# Patient Record
Sex: Male | Born: 1985 | Race: White | Hispanic: No | Marital: Single | Smoking: Heavy tobacco smoker
Health system: Southern US, Community
[De-identification: ages and names within clinical notes are randomized; demographics above are authoritative.]

## PROBLEM LIST (undated history)

## (undated) DIAGNOSIS — N200 Calculus of kidney: Secondary | ICD-10-CM

---

## 2007-11-24 ENCOUNTER — Emergency Department (HOSPITAL_COMMUNITY): Admission: EM | Admit: 2007-11-24 | Discharge: 2007-11-24 | Payer: Self-pay | Admitting: Emergency Medicine

## 2011-05-01 ENCOUNTER — Emergency Department (HOSPITAL_BASED_OUTPATIENT_CLINIC_OR_DEPARTMENT_OTHER)
Admission: EM | Admit: 2011-05-01 | Discharge: 2011-05-01 | Disposition: A | Discharge status: Home / Self Care | Payer: Self-pay | Attending: Emergency Medicine | Admitting: Emergency Medicine

## 2011-05-01 DIAGNOSIS — K089 Disorder of teeth and supporting structures, unspecified: Secondary | ICD-10-CM | POA: Insufficient documentation

## 2015-06-16 ENCOUNTER — Emergency Department (HOSPITAL_COMMUNITY)
Admission: EM | Admit: 2015-06-16 | Discharge: 2015-06-16 | Disposition: A | Discharge status: Home / Self Care | Payer: Self-pay | Attending: Emergency Medicine | Admitting: Emergency Medicine

## 2015-06-16 ENCOUNTER — Emergency Department (HOSPITAL_COMMUNITY): Payer: Self-pay

## 2015-06-16 ENCOUNTER — Encounter (HOSPITAL_COMMUNITY): Payer: Self-pay | Admitting: Emergency Medicine

## 2015-06-16 DIAGNOSIS — E860 Dehydration: Secondary | ICD-10-CM | POA: Insufficient documentation

## 2015-06-16 DIAGNOSIS — T679XXA Effect of heat and light, unspecified, initial encounter: Secondary | ICD-10-CM | POA: Insufficient documentation

## 2015-06-16 DIAGNOSIS — R55 Syncope and collapse: Secondary | ICD-10-CM | POA: Insufficient documentation

## 2015-06-16 DIAGNOSIS — Z87442 Personal history of urinary calculi: Secondary | ICD-10-CM | POA: Insufficient documentation

## 2015-06-16 DIAGNOSIS — R202 Paresthesia of skin: Secondary | ICD-10-CM | POA: Insufficient documentation

## 2015-06-16 DIAGNOSIS — Z720 Tobacco use: Secondary | ICD-10-CM | POA: Insufficient documentation

## 2015-06-16 DIAGNOSIS — Y998 Other external cause status: Secondary | ICD-10-CM | POA: Insufficient documentation

## 2015-06-16 DIAGNOSIS — Y9289 Other specified places as the place of occurrence of the external cause: Secondary | ICD-10-CM | POA: Insufficient documentation

## 2015-06-16 DIAGNOSIS — X30XXXA Exposure to excessive natural heat, initial encounter: Secondary | ICD-10-CM | POA: Insufficient documentation

## 2015-06-16 DIAGNOSIS — Y9301 Activity, walking, marching and hiking: Secondary | ICD-10-CM | POA: Insufficient documentation

## 2015-06-16 DIAGNOSIS — R0789 Other chest pain: Secondary | ICD-10-CM | POA: Insufficient documentation

## 2015-06-16 HISTORY — DX: Calculus of kidney: N20.0

## 2015-06-16 LAB — COMPREHENSIVE METABOLIC PANEL
ALBUMIN: 4 g/dL (ref 3.5–5.0)
ALT: 9 U/L — ABNORMAL LOW (ref 17–63)
AST: 21 U/L (ref 15–41)
Alkaline Phosphatase: 88 U/L (ref 38–126)
Anion gap: 10 (ref 5–15)
BUN: 12 mg/dL (ref 6–20)
CO2: 23 mmol/L (ref 22–32)
CREATININE: 0.74 mg/dL (ref 0.61–1.24)
Calcium: 9.2 mg/dL (ref 8.9–10.3)
Chloride: 108 mmol/L (ref 101–111)
Glucose, Bld: 91 mg/dL (ref 65–99)
POTASSIUM: 3.8 mmol/L (ref 3.5–5.1)
SODIUM: 141 mmol/L (ref 135–145)
Total Bilirubin: 0.8 mg/dL (ref 0.3–1.2)
Total Protein: 7.3 g/dL (ref 6.5–8.1)

## 2015-06-16 LAB — CBC WITH DIFFERENTIAL/PLATELET
BASOS ABS: 0 10*3/uL (ref 0.0–0.1)
BASOS PCT: 0 % (ref 0–1)
Eosinophils Absolute: 0.2 10*3/uL (ref 0.0–0.7)
Eosinophils Relative: 1 % (ref 0–5)
HEMATOCRIT: 41.9 % (ref 39.0–52.0)
HEMOGLOBIN: 14.5 g/dL (ref 13.0–17.0)
LYMPHS ABS: 4 10*3/uL (ref 0.7–4.0)
LYMPHS PCT: 31 % (ref 12–46)
MCH: 30.2 pg (ref 26.0–34.0)
MCHC: 34.6 g/dL (ref 30.0–36.0)
MCV: 87.3 fL (ref 78.0–100.0)
MONOS PCT: 6 % (ref 3–12)
Monocytes Absolute: 0.8 10*3/uL (ref 0.1–1.0)
NEUTROS ABS: 8 10*3/uL — AB (ref 1.7–7.7)
Neutrophils Relative %: 62 % (ref 43–77)
Platelets: 217 10*3/uL (ref 150–400)
RBC: 4.8 MIL/uL (ref 4.22–5.81)
RDW: 13.1 % (ref 11.5–15.5)
WBC: 13 10*3/uL — AB (ref 4.0–10.5)

## 2015-06-16 LAB — URINALYSIS, ROUTINE W REFLEX MICROSCOPIC
GLUCOSE, UA: NEGATIVE mg/dL
HGB URINE DIPSTICK: NEGATIVE
Ketones, ur: NEGATIVE mg/dL
Nitrite: NEGATIVE
PH: 5.5 (ref 5.0–8.0)
PROTEIN: NEGATIVE mg/dL
SPECIFIC GRAVITY, URINE: 1.033 — AB (ref 1.005–1.030)
UROBILINOGEN UA: 1 mg/dL (ref 0.0–1.0)

## 2015-06-16 LAB — URINE MICROSCOPIC-ADD ON

## 2015-06-16 LAB — I-STAT CG4 LACTIC ACID, ED: LACTIC ACID, VENOUS: 0.57 mmol/L (ref 0.5–2.0)

## 2015-06-16 LAB — CK: CK TOTAL: 236 U/L (ref 49–397)

## 2015-06-16 MED ORDER — ONDANSETRON HCL 4 MG/2ML IJ SOLN
4.0000 mg | Freq: Once | INTRAMUSCULAR | Status: AC
  Administered 2015-06-16: 4 mg via INTRAVENOUS
  Filled 2015-06-16: qty 2

## 2015-06-16 MED ORDER — IOHEXOL 300 MG/ML  SOLN
100.0000 mL | Freq: Once | INTRAMUSCULAR | Status: AC | PRN
  Administered 2015-06-16: 100 mL via INTRAVENOUS

## 2015-06-16 MED ORDER — IOHEXOL 300 MG/ML  SOLN
25.0000 mL | Freq: Once | INTRAMUSCULAR | Status: AC | PRN
  Administered 2015-06-16: 25 mL via ORAL

## 2015-06-16 MED ORDER — SODIUM CHLORIDE 0.9 % IV SOLN
1000.0000 mL | Freq: Once | INTRAVENOUS | Status: AC
  Administered 2015-06-16: 1000 mL via INTRAVENOUS

## 2015-06-16 MED ORDER — SODIUM CHLORIDE 0.9 % IV SOLN
1000.0000 mL | Freq: Once | INTRAVENOUS | Status: AC
Start: 2015-06-16 — End: 2015-06-16
  Administered 2015-06-16: 1000 mL via INTRAVENOUS

## 2015-06-16 MED ORDER — SODIUM CHLORIDE 0.9 % IV SOLN
1000.0000 mL | INTRAVENOUS | Status: DC
  Administered 2015-06-16: 1000 mL via INTRAVENOUS

## 2015-06-16 NOTE — ED Provider Notes (Signed)
CSN: [id number]     Arrival date & time 06/16/15  0207 History  This chart was scribed for Marisa Severin, MD by Doreatha Martin, ED Scribe. This patient was seen in room WA16/WA16 and the patient's care was started at 3:32 AM.     Chief Complaint  Patient presents with  . Heat Exposure  . Flank Pain   The history is provided by the patient. No language interpreter was used.    HPI Comments: Calvin Golden is a 29 y.o. male who presents to the Emergency Department complaining of moderate left sided sharp chest pain radiating to the flank onset 0130. Pt states associated episode of near-syncope, chest tightness and bilateral tingling of the feet and hands. He states he was walking from his house to his new job (an 8 hr walk) and had been walking for 7 hours when he experienced the pain. He states that he sat for 20 minutes with no relief of the pain. Pt reports that he had two 20oz bottles of water while walking. Pt states that pain is worsened with inspiration. Pt is a current smoker, 0.25 ppd. He denies falls and injuries, Hx of mono, current feeling symptoms similar to previous kidney stones. He also denies vomiting, sore throat.   Past Medical History  Diagnosis Date  . Kidney stones    History reviewed. No pertinent past surgical history. History reviewed. No pertinent family history. History  Substance Use Topics  . Smoking status: Heavy Tobacco Smoker -- 1.00 packs/day for 13 years    Types: Cigarettes  . Smokeless tobacco: Never Used  . Alcohol Use: Yes    Review of Systems  Respiratory: Positive for chest tightness.   Cardiovascular: Positive for chest pain.  Neurological:       Near-syncope, tingling of the hands and feet.  All other systems reviewed and are negative.  Allergies  Review of patient's allergies indicates no known allergies.  Home Medications   Prior to Admission medications   Not on File   BP 123/71 mmHg  Pulse 85  Temp(Src) 98.4 F (36.9 C) (Oral)  Resp 18   Ht 5\' 9"  (1.753 m)  Wt 200 lb (90.719 kg)  BMI 29.52 kg/m2  SpO2 99% Physical Exam  Constitutional: He is oriented to person, place, and time. He appears well-developed and well-nourished.  HENT:  Head: Normocephalic and atraumatic.  Nose: Nose normal.  Mouth/Throat: Oropharynx is clear and moist.  Eyes: Conjunctivae and EOM are normal. Pupils are equal, round, and reactive to light.  Neck: Normal range of motion. Neck supple. No JVD present. No tracheal deviation present. No thyromegaly present.  Cardiovascular: Normal rate, regular rhythm, normal heart sounds and intact distal pulses.  Exam reveals no gallop and no friction rub.   No murmur heard. Pulmonary/Chest: Effort normal and breath sounds normal. No stridor. No respiratory distress. He has no wheezes. He has no rales. He exhibits tenderness (tenderness with palpation over left chest wall).  Abdominal: Soft. Bowel sounds are normal. He exhibits no distension and no mass. There is tenderness (patient with tenderness to palpation of left upper quadrant). There is no rebound and no guarding.  Musculoskeletal: Normal range of motion. He exhibits no edema or tenderness.  Lymphadenopathy:    He has no cervical adenopathy.  Neurological: He is alert and oriented to person, place, and time. He displays normal reflexes. He exhibits normal muscle tone. Coordination normal.  Skin: Skin is warm and dry. No rash noted. No erythema. No pallor.  Psychiatric: He has a normal mood and affect. His behavior is normal. Judgment and thought content normal.  Nursing note and vitals reviewed.   ED Course  Procedures (including critical care time) DIAGNOSTIC STUDIES: Oxygen Saturation is 99% on RA, normal by my interpretation.    COORDINATION OF CARE: 3:38 AM Discussed treatment plan with pt at bedside and pt agreed to plan.   Labs Review Labs Reviewed  URINALYSIS, ROUTINE W REFLEX MICROSCOPIC (NOT AT [facility]) - Abnormal; Notable for the following:     Color, Urine AMBER (*)    APPearance CLOUDY (*)    Specific Gravity, Urine 1.033 (*)    Bilirubin Urine SMALL (*)    Leukocytes, UA SMALL (*)    All other components within normal limits  COMPREHENSIVE METABOLIC PANEL - Abnormal; Notable for the following:    ALT 9 (*)    All other components within normal limits  CBC WITH DIFFERENTIAL/PLATELET - Abnormal; Notable for the following:    WBC 13.0 (*)    Neutro Abs 8.0 (*)    All other components within normal limits  CK  URINE MICROSCOPIC-ADD ON  I-STAT CG4 LACTIC ACID, ED    Imaging Review Dg Chest 2 View  06/16/2015   CLINICAL DATA:  Initial evaluation for acute chest pain, shortness of breath.  EXAM: CHEST  2 VIEW  COMPARISON:  Prior study from 01/12/2015  FINDINGS: The cardiac and mediastinal silhouettes are stable in size and contour, and remain within normal limits.  The lungs are normally inflated. Mild elevation left hemidiaphragm is stable. No airspace consolidation, pleural effusion, or pulmonary edema is identified. There is no pneumothorax.  No acute osseous abnormality identified.  Scoliosis stable.  IMPRESSION: No active cardiopulmonary disease.   Electronically Signed   By: Rise Mu M.D.   On: 06/16/2015 04:20   Ct Abdomen Pelvis W Contrast  06/16/2015   CLINICAL DATA:  Left upper quadrant pain radiating to the left flank with splinting. Near syncope during 6 hour walk.  EXAM: CT ABDOMEN AND PELVIS WITH CONTRAST  TECHNIQUE: Multidetector CT imaging of the abdomen and pelvis was performed using the standard protocol following bolus administration of intravenous contrast.  CONTRAST:  OMNIPAQUE IOHEXOL 300 MG/ML  SOLN  COMPARISON:  None.  FINDINGS: Lower chest:  The included lung bases are clear.  Hepatobiliary: No focal hepatic lesion. The gallbladder is decompressed. No biliary dilatation.  Pancreas: Normal.  Spleen: Mild splenomegaly, spleen measures 14.8 cm in cranial caudal dimension.  Adrenals/Urinary  Tract: Symmetric renal enhancement. No hydronephrosis. No large renal stones. The adrenal glands are normal.  Stomach/Bowel: Stomach is physiologically distended. There are no dilated or thickened bowel loops. The appendix is normal. Equivocal mild diffuse colonic wall thickening in the ascending through the descending colon versus nondistention.  Vascular/Lymphatic: No retroperitoneal adenopathy. Abdominal aorta is normal in caliber.  Reproductive: Normal.  Bladder: Physiologically distended.  Other: Tiny fat containing umbilical hernia.  Musculoskeletal: There are no acute or suspicious osseous abnormalities.  IMPRESSION: 1. Equivocal mild colonic wall thickening versus nondistention. 2. Mild splenomegaly.   Electronically Signed   By: Rubye Oaks M.D.   On: 06/16/2015 07:02     EKG Interpretation None      MDM   Final diagnoses:  Dehydration  Heat effect, initial encounter    I personally performed the services described in this documentation, which was scribed in my presence. The recorded information has been reviewed and is accurate.  29 year old male with possible heat exhaustion,  reports walking 6 hours and then developing left chest and upper abdomen pain, near syncope.  Patient is not hyperthermic or tachycardic upon arrival.    Patient is tender to palpation left upper quadrant.  Plan for labs, urinalysis, IV fluids  Pt feeling better.  CT scan with mild splenomegaly.  Pt to increase fluids, return precautions given.  Marisa Severin, MD 06/16/15 [phone]

## 2015-06-16 NOTE — Discharge Instructions (Signed)
Heat-Related Illness °Heat-related illnesses occur when the body is unable to properly cool itself. The body normally cools itself by sweating. However, under some conditions sweating is not enough. In these cases, a person's body temperature rises rapidly. Very high body temperatures may damage the brain or other vital organs. Some examples of heat-related illnesses include: °· Heat stroke. This occurs when the body is unable to regulate its temperature. The body's temperature rises rapidly, the sweating mechanism fails, and the body is unable to cool down. Body temperature may rise to 106° F (41° C) or higher within 10 to 15 minutes. Heat stroke can cause death or permanent disability if emergency treatment is not provided. °· Heat exhaustion. This is a milder form of heat-related illness that can develop after several days of exposure to high temperatures and not enough fluids. It is the body's response to an excessive loss of the water and salt contained in sweat. °· Heat cramps. These usually affect people who sweat a lot during heavy activity. This sweating drains the body's salt and moisture. The low salt level in the muscles causes painful cramps. Heat cramps may also be a symptom of heat exhaustion. Heat cramps usually occur in the abdomen, arms, or legs. Get medical attention for cramps if you have heart problems or are on a low-sodium diet. °Those that are at greatest risk for heat-related illnesses include:  °· The elderly. °· Infant and the very young. °· People with mental illness and chronic diseases. °· People who are overweight (obese). °· Young and healthy people can even succumb to heat if they participate in strenuous physical activities during hot weather. °CAUSES  °Several factors affect the body's ability to cool itself during extremely hot weather. When the humidity is high, sweat will not evaporate as quickly. This prevents the body from releasing heat quickly. Other factors that can affect  the body's ability to cool down include:  °· Age. °· Obesity. °· Fever. °· Dehydration. °· Heart disease. °· Mental illness. °· Poor circulation. °· Sunburn. °· Prescription drug use. °· Alcohol use. °SYMPTOMS  °Heat stroke: Warning signs of heat stroke vary, but may include: °· An extremely high body temperature (above 103°F orally). °· A fast, strong pulse. °· Dizziness. °· Confusion. °· Red, hot, and dry skin. °· No sweating. °· Throbbing headache. °· Feeling sick to your stomach (nauseous). °· Unconsciousness. °Heat exhaustion: Warning signs of heat exhaustion include: °· Heavy sweating. °· Tiredness. °· Headache. °· Paleness. °· Weakness. °· Feeling sick to your stomach (nauseous) or vomiting. °· Muscle cramps. °Heat cramps °· Muscle pains or spasms. °TREATMENT  °Heat stroke °· Get into a cool environment. An indoor place that is air-conditioned may be best. °· Take a cool shower or bath. Have someone around to make sure you are okay. °· Take your temperature. Make sure it is going down. °Heat exhaustion °· Drink plenty of fluids. Do not drink liquids that contain caffeine, alcohol, or large amounts of sugar. These cause you to lose more body fluid. Also, avoid very cold drinks. They can cause stomach cramps. °· Get into a cool environment. An indoor place that is air-conditioned may be best. °· Take a cool shower or bath. Have someone around to make sure you are okay. °· Put on lightweight clothing. °Heat cramps °· Stop whatever activity you were doing. Do not attempt to do that activity for at least 3 hours after the cramps have gone away. °· Get into a cool environment. An indoor   place that is air-conditioned may be best. °HOME CARE INSTRUCTIONS  °To protect your health when temperatures are extremely high, follow these tips: °· During heavy exercise in a hot environment, drink two to four glasses (16-32 ounces) of cool fluids each hour. Do not wait until you are thirsty to drink. Warning: If your  caregiver limits the amount of fluid you drink or has you on water pills, ask how much you should drink while the weather is hot. °· Do not drink liquids that contain caffeine, alcohol, or large amounts of sugar. These cause you to lose more body fluid. °· Avoid very cold drinks. They can cause stomach cramps. °· Wear appropriate clothing. Choose lightweight, light-colored, loose-fitting clothing. °· If you must be outdoors, try to limit your outdoor activity to morning and evening hours. Try to rest often in shady areas. °· If you are not used to working or exercising in a hot environment, start slowly and pick up the pace gradually. °· Stay cool in an air-conditioned place if possible. If your home does not have air conditioning, go to the shopping mall or public library. °· Taking a cool shower or bath may help you cool off. °SEEK MEDICAL CARE IF:  °· You see any of the symptoms listed above. You may be dealing with a life-threatening emergency. °· Symptoms worsen or last longer than 1 hour. °· Heat cramps do not get better in 1 hour. °MAKE SURE YOU:  °· Understand these instructions. °· Will watch your condition. °· Will get help right away if you are not doing well or get worse. °Document Released: 08/15/2008 Document Revised: 01/29/2012 Document Reviewed: 08/15/2008 °[employer]® Patient Information ©2015 [employer]. This information is not intended to replace advice given to you by your health care provider. Make sure you discuss any questions you have with your health care provider. ° °Dehydration, Adult °Dehydration is when you lose more fluids from the body than you take in. Vital organs like the kidneys, brain, and heart cannot function without a proper amount of fluids and salt. Any loss of fluids from the body can cause dehydration.  °CAUSES  °· Vomiting. °· Diarrhea. °· Excessive sweating. °· Excessive urine output. °· Fever. °SYMPTOMS  °Mild dehydration °· Thirst. °· Dry lips. °· Slightly dry  mouth. °Moderate dehydration °· Very dry mouth. °· Sunken eyes. °· Skin does not bounce back quickly when lightly pinched and released. °· Dark urine and decreased urine production. °· Decreased tear production. °· Headache. °Severe dehydration °· Very dry mouth. °· Extreme thirst. °· Rapid, weak pulse (more than 100 beats per minute at rest). °· Cold hands and feet. °· Not able to sweat in spite of heat and temperature. °· Rapid breathing. °· Blue lips. °· Confusion and lethargy. °· Difficulty being awakened. °· Minimal urine production. °· No tears. °DIAGNOSIS  °Your caregiver will diagnose dehydration based on your symptoms and your exam. Blood and urine tests will help confirm the diagnosis. The diagnostic evaluation should also identify the cause of dehydration. °TREATMENT  °Treatment of mild or moderate dehydration can often be done at home by increasing the amount of fluids that you drink. It is best to drink small amounts of fluid more often. Drinking too much at one time can make vomiting worse. Refer to the home care instructions below. °Severe dehydration needs to be treated at the hospital where you will probably be given intravenous (IV) fluids that contain water and electrolytes. °HOME CARE INSTRUCTIONS  °· Ask your caregiver   about specific rehydration instructions.  Drink enough fluids to keep your urine clear or pale yellow.  Drink small amounts frequently if you have nausea and vomiting.  Eat as you normally do.  Avoid:  Foods or drinks high in sugar.  Carbonated drinks.  Juice.  Extremely hot or cold fluids.  Drinks with caffeine.  Fatty, greasy foods.  Alcohol.  Tobacco.  Overeating.  Gelatin desserts.  Wash your hands well to avoid spreading bacteria and viruses.  Only take over-the-counter or prescription medicines for pain, discomfort, or fever as directed by your caregiver.  Ask your caregiver if you should continue all prescribed and over-the-counter medicines.  Keep all  follow-up appointments with your caregiver. SEEK MEDICAL CARE IF:  You have abdominal pain and it increases or stays in one area (localizes).  You have a rash, stiff neck, or severe headache.  You are irritable, sleepy, or difficult to awaken.  You are weak, dizzy, or extremely thirsty. SEEK IMMEDIATE MEDICAL CARE IF:   You are unable to keep fluids down or you get worse despite treatment.  You have frequent episodes of vomiting or diarrhea.  You have blood or green matter (bile) in your vomit.  You have blood in your stool or your stool looks black and tarry.  You have not urinated in 6 to 8 hours, or you have only urinated a small amount of very dark urine.  You have a fever.  You faint. MAKE SURE YOU:   Understand these instructions.  Will watch your condition.  Will get help right away if you are not doing well or get worse. Document Released: 11/06/2005 Document Revised: 01/29/2012 Document Reviewed: 06/26/2011 [facility] Patient Information 2015 [place], [place]. This information is not intended to replace advice given to you by your health care provider. Make sure you discuss any questions you have with your health care provider.  Rehydration, Adult Rehydration is the replacement of body fluids lost during dehydration. Dehydration is an extreme loss of body fluids to the point of body function impairment. There are many ways extreme fluid loss can occur, including vomiting, diarrhea, or excess sweating. Recovering from dehydration requires replacing lost fluids, continuing to eat to maintain strength, and avoiding foods and beverages that may contribute to further fluid loss or may increase nausea. HOW TO REHYDRATE In most cases, rehydration involves the replacement of not only fluids but also carbohydrates and basic body salts. Rehydration with an oral rehydration solution is one way to replace essential nutrients lost through dehydration. An oral rehydration solution can  be purchased at pharmacies, retail stores, and online. Premixed packets of powder that you combine with water to make a solution are also sold. You can prepare an oral rehydration solution at home by mixing the following ingredients together:    - tsp table salt.   tsp baking soda.   tsp salt substitute containing potassium chloride.  1 tablespoons sugar.  1 L (34 oz) of water. Be sure to use exact measurements. Including too much sugar can make diarrhea worse. Drink -1 cup (120-240 mL) of oral rehydration solution each time you have diarrhea or vomit. If drinking this amount makes your vomiting worse, try drinking smaller amounts more often. For example, drink 1-3 tsp every 5-10 minutes.  A general rule for staying hydrated is to drink 1-2 L of fluid per day. Talk to your caregiver about the specific amount you should be drinking each day. Drink enough fluids to keep your urine clear or pale yellow. EATING  WHEN DEHYDRATED Even if you have had severe sweating or you are having diarrhea, do not stop eating. Many healthy items in a normal diet are okay to continue eating while recovering from dehydration. The following tips can help you to lessen nausea when you eat:  Ask someone else to prepare your food. Cooking smells may worsen nausea.  Eat in a well-ventilated room away from cooking smells.  Sit up when you eat. Avoid lying down until 1-2 hours after eating.  Eat small amounts when you eat.  Eat foods that are easy to digest. These include soft, well-cooked, or mashed foods. FOODS AND BEVERAGES TO AVOID Avoid eating or drinking the following foods and beverages that may increase nausea or further loss of fluid:   Fruit juices with a high sugar content, such as concentrated juices.  Alcohol.  Beverages containing caffeine.  Carbonated drinks. They may cause a lot of gas.  Foods that may cause a lot of gas, such as cabbage, broccoli, and beans.  Fatty, greasy, and fried  foods.  Spicy, very salty, and very sweet foods or drinks.  Foods or drinks that are very hot or very cold. Consume food or drinks at or near room temperature.  Foods that need a lot of chewing, such as raw vegetables.  Foods that are sticky or hard to swallow, such as peanut butter. Document Released: 01/29/2012 Document Revised: 07/31/2012 Document Reviewed: 01/29/2012 [facility] Patient Information 2015 [place], [place]. This information is not intended to replace advice given to you by your health care provider. Make sure you discuss any questions you have with your health care provider.

## 2015-06-16 NOTE — ED Notes (Signed)
Bed: OZ30 Expected date: 06/16/15 Expected time: 1:43 AM Means of arrival: Ambulance Comments: 29 yo M  Side pain, dizziness

## 2015-06-16 NOTE — ED Notes (Signed)
Patient has been walking every since 7pm due to car not working and he needed to get to work by 6 am. Patient complaining of pain in the left flank. Patient also complaining tingling in fingers and legs after walking a while and stopping.

## 2016-01-14 IMAGING — CT CT ABD-PELV W/ CM
2 of 4 series · 16 of 46 positions shown, 18 images · IV contrast (omnipaque)
Comparison: None.

CLINICAL DATA: Left upper quadrant pain radiating to the left flank
with splinting. Near syncope during 6 hour walk.

EXAM:
CT ABDOMEN AND PELVIS WITH CONTRAST
TECHNIQUE: Multidetector CT imaging of the abdomen and pelvis was performed
using the standard protocol following bolus administration of
intravenous contrast.
CONTRAST:  100mL OMNIPAQUE IOHEXOL 300 MG/ML  SOLN

[Series 2: abd/pel with · axial · 0.75mm/px · z∈[+1071,+1506]mm · 13 of 95 slices shown, 15 images]
[im 4/95  soft-tissue]
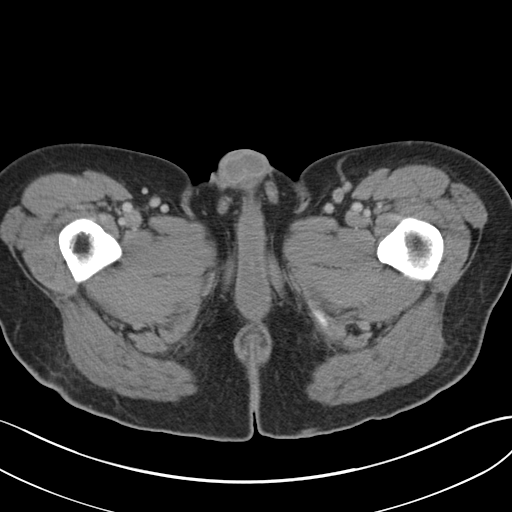
[im 4/95  bone]
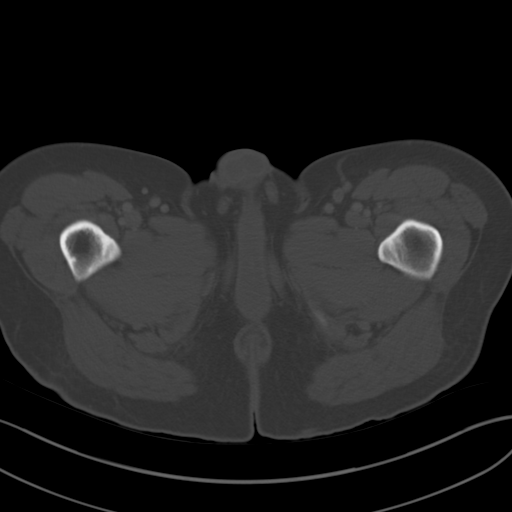
[im 11/95  soft-tissue]
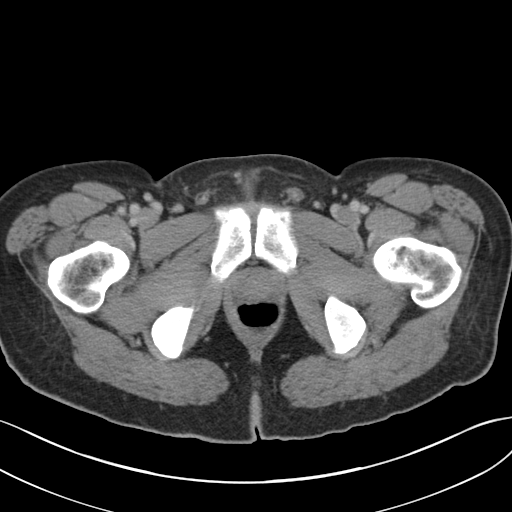
[im 19/95  soft-tissue]
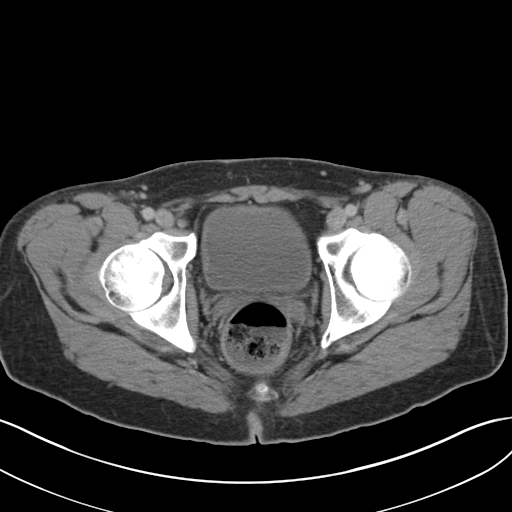
[im 26/95  soft-tissue]
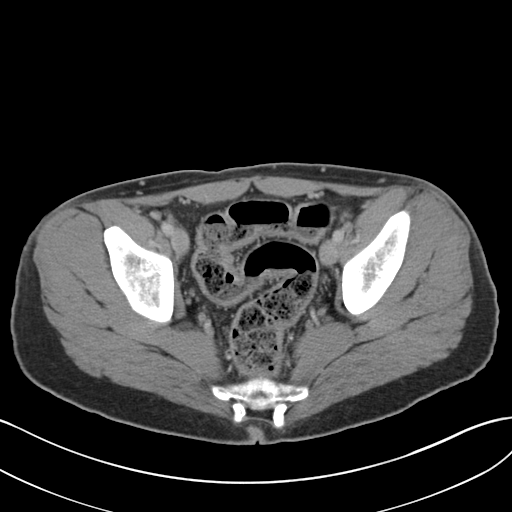
[im 33/95  soft-tissue]
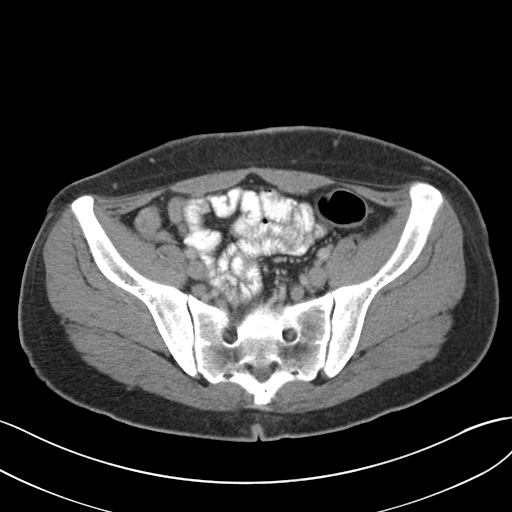
[im 40/95  soft-tissue]
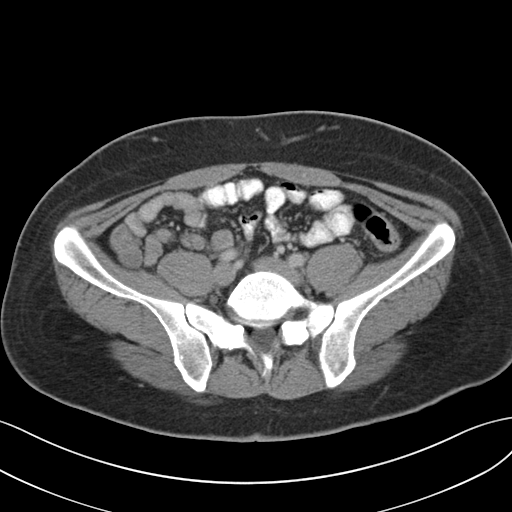
[im 48/95  soft-tissue]
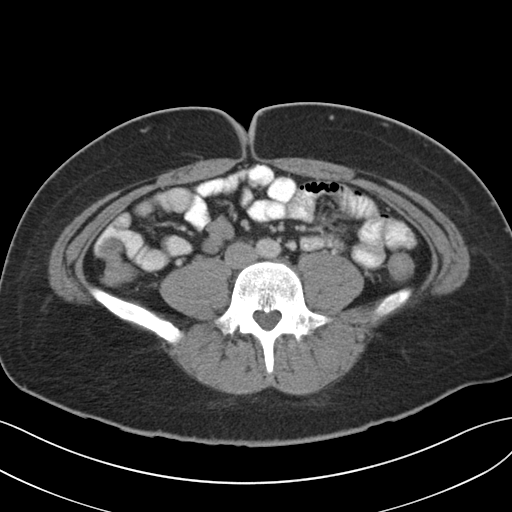
[im 55/95  soft-tissue]
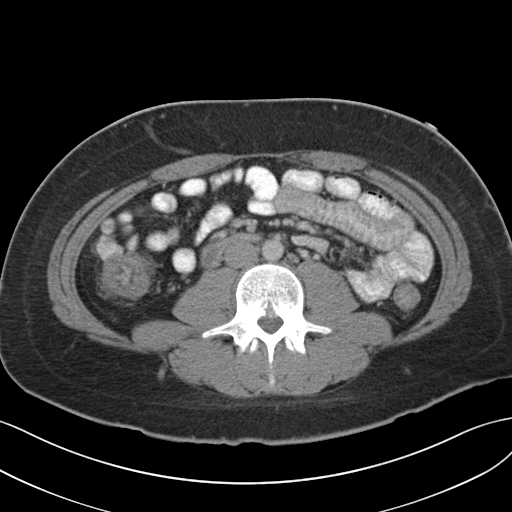
[im 62/95  soft-tissue]
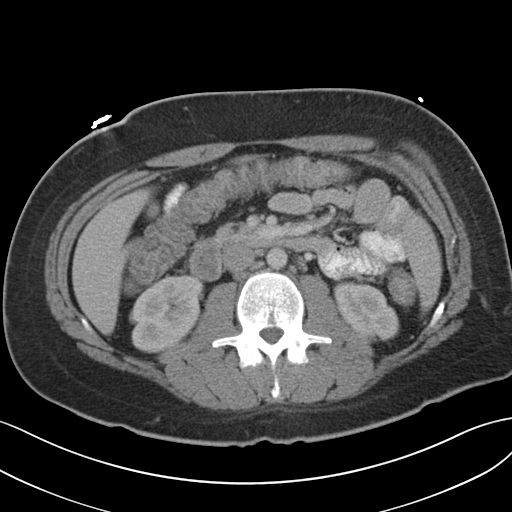
[im 62/95  bone]
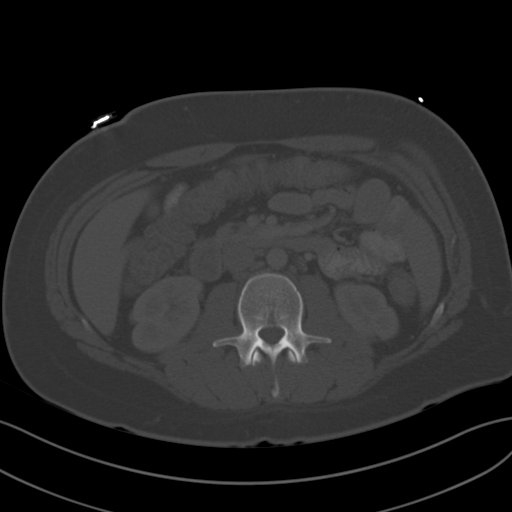
[im 69/95  soft-tissue]
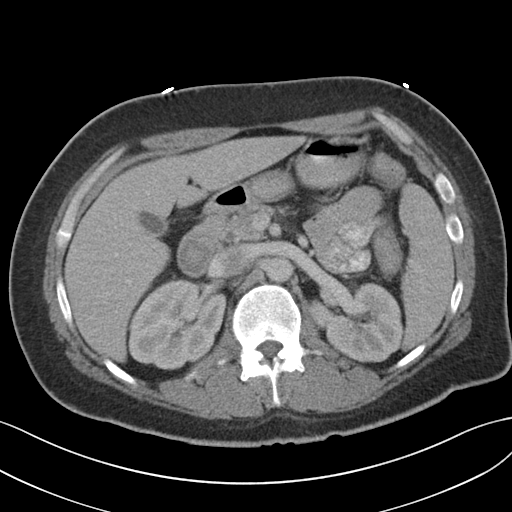
[im 76/95  soft-tissue]
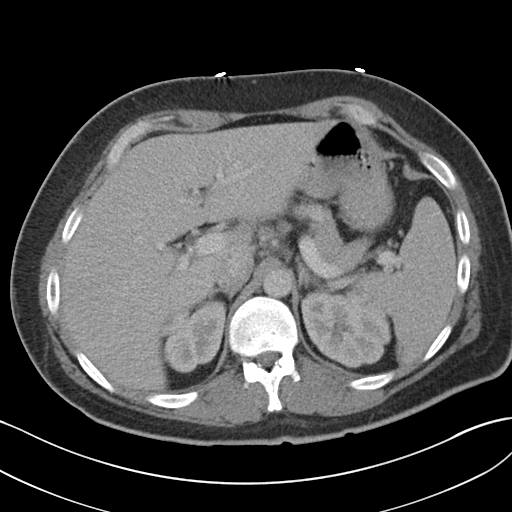
[im 84/95  soft-tissue]
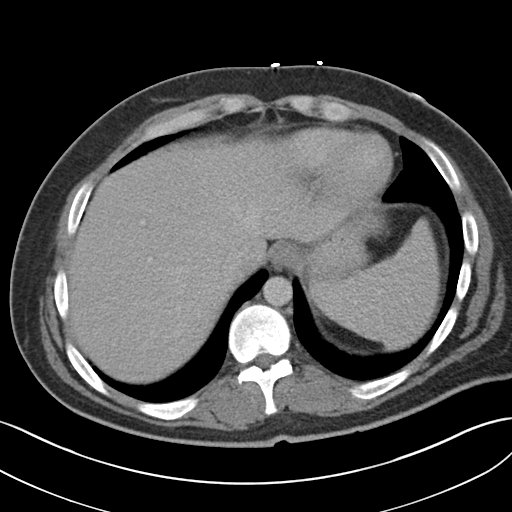
[im 91/95  soft-tissue]
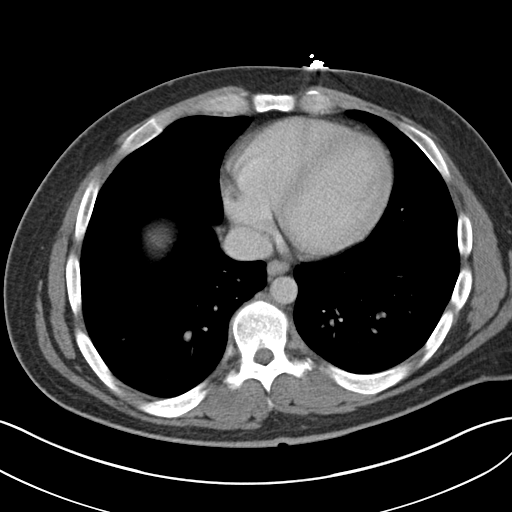

[Series 4: coronal a/|p · coronal · 0.72mm/px · 3 of 87 slices shown]
[im 29/87  soft-tissue]
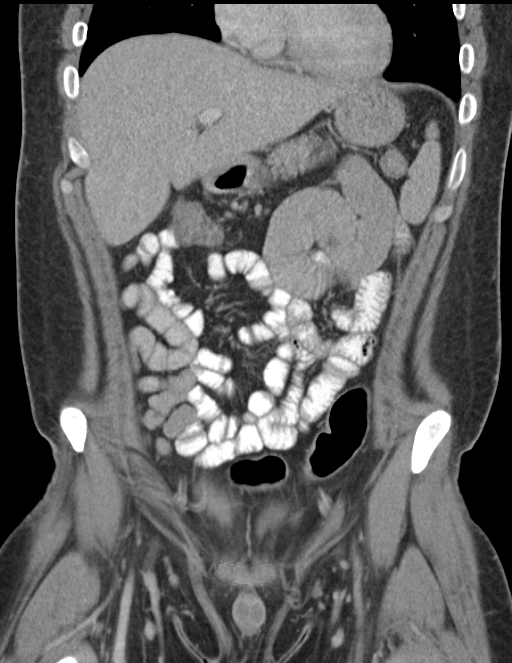
[im 39/87  soft-tissue]
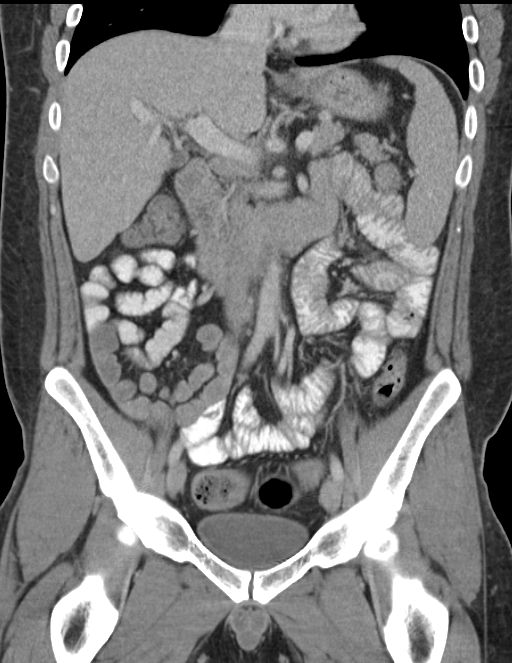
[im 48/87  soft-tissue]
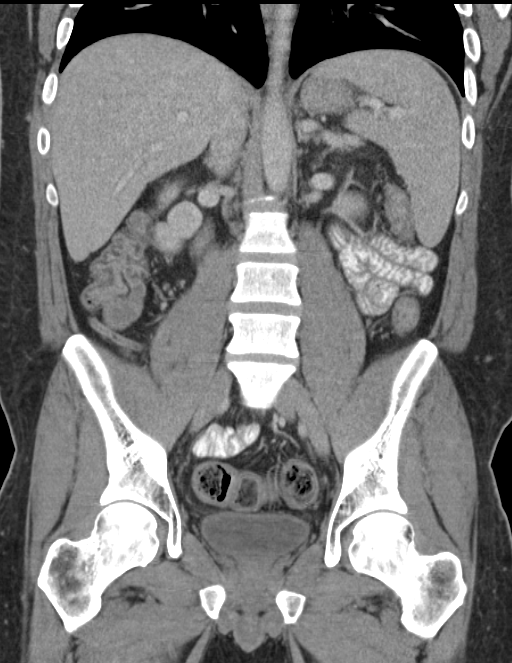

[16 of 46 positions shown; findings below may reference images not displayed]

FINDINGS: Lower chest:  The included lung bases are clear.

Hepatobiliary: No focal hepatic lesion. The gallbladder is
decompressed. No biliary dilatation.

Pancreas: Normal.

Spleen: Mild splenomegaly, spleen measures 14.8 cm in cranial caudal
dimension.

Adrenals/Urinary Tract: Symmetric renal enhancement. No
hydronephrosis. No large renal stones. The adrenal glands are
normal.

Stomach/Bowel: Stomach is physiologically distended. There are no
dilated or thickened bowel loops. The appendix is normal. Equivocal
mild diffuse colonic wall thickening in the ascending through the
descending colon versus nondistention.

Vascular/Lymphatic: No retroperitoneal adenopathy. Abdominal aorta
is normal in caliber.

Reproductive: Normal.

Bladder: Physiologically distended.

Other: Tiny fat containing umbilical hernia.

Musculoskeletal: There are no acute or suspicious osseous
abnormalities.
IMPRESSION: 1. Equivocal mild colonic wall thickening versus nondistention.
2. Mild splenomegaly.
